# Patient Record
Sex: Male | Born: 1985 | Race: Black or African American | Hispanic: No | Marital: Single | Smoking: Never smoker
Health system: Southern US, Community
[De-identification: ages and names within clinical notes are randomized; demographics above are authoritative.]

---

## 2012-10-24 ENCOUNTER — Emergency Department (HOSPITAL_COMMUNITY)
Admission: EM | Admit: 2012-10-24 | Discharge: 2012-10-24 | Disposition: A | Discharge status: Home / Self Care | Payer: Non-veteran care | Attending: Emergency Medicine | Admitting: Emergency Medicine

## 2012-10-24 DIAGNOSIS — H579 Unspecified disorder of eye and adnexa: Secondary | ICD-10-CM | POA: Insufficient documentation

## 2012-10-24 DIAGNOSIS — H109 Unspecified conjunctivitis: Secondary | ICD-10-CM | POA: Insufficient documentation

## 2012-10-24 MED ORDER — FLUORESCEIN SODIUM 1 MG OP STRP
1.0000 | ORAL_STRIP | Freq: Once | OPHTHALMIC | Status: DC
  Filled 2012-10-24: qty 1

## 2012-10-24 MED ORDER — TETRACAINE HCL 0.5 % OP SOLN
1.0000 [drp] | Freq: Once | OPHTHALMIC | Status: DC
  Filled 2012-10-24: qty 2

## 2012-10-24 MED ORDER — POLYMYXIN B-TRIMETHOPRIM 10000-0.1 UNIT/ML-% OP SOLN: 1.0000 [drp] | OPHTHALMIC | Status: AC

## 2012-10-24 NOTE — ED Notes (Signed)
Pt c/o bilateral eye redness for the past 2 days. Pt states now he has drainage from both eyes. Pt states he woke up with his eyes glued shut this morning. Pt c/o slight itching to eyes. Pt a/o x 3. Pt ambulatory to exam room with steady gait.

## 2012-10-24 NOTE — ED Provider Notes (Signed)
Medical screening examination/treatment/procedure(s) were performed by non-physician practitioner and as supervising physician I was immediately available for consultation/collaboration.  Doug Sou, MD 10/24/12 [phone]

## 2012-10-24 NOTE — ED Provider Notes (Signed)
History    This chart was scribed for non-physician practitioner Roxy Horseman, PA working with Doug Sou, MD by Quintella Reichert, ED Scribe. This patient was seen in room WTR7/WTR7 and the patient's care was started at 3:28 PM.  CSN: [id number]  Arrival date & time 10/24/12  1450    Chief Complaint  Patient presents with  . Eye Redness     The history is provided by the patient. No language interpreter was used.    HPI Comments: Fernando Turner is a 27 y.o. male who presents to the Emergency Department with a chief complaint of bilateral eye redness and discharge.  Pt reports that he first noticed the redness 2 days ago in both eyes, and that this morning on waking he woke up with his eyes "glued shut," and he has had discharge throughout the day today.  He also complains of mild itching but notes it may be because he has been "messing with" his eyes.  He denies pain, swelling, blurred vision, or obvious recent changes to visual acuity.   He denies any foreign bodies in the eyes to his knowledge but notes that he works in [employer] and may have rubbed his eyes after touching food.  He denies recent exposure to similar symptoms.   He does not wear contacts or glasses.  He denies medication allergies.  No fevers, chills, nausea, or vomiting.   No past medical history on file.  No past surgical history on file.  No family history on file.   History  Substance Use Topics  . Smoking status: Not on file  . Smokeless tobacco: Not on file  . Alcohol Use: Not on file    Review of Systems A complete 10 system review of systems was obtained and all systems are negative except as noted in the HPI and PMH.    Allergies  Review of patient's allergies indicates no known allergies.  Home Medications  No current outpatient prescriptions on file.  BP 127/63  Pulse 61  Temp(Src) 97.7 F (36.5 C) (Oral)  Resp 16  SpO2 100%   Physical Exam  Nursing note and vitals  reviewed. Constitutional: He is oriented to person, place, and time. He appears well-developed and well-nourished. No distress.  HENT:  Head: Normocephalic and atraumatic.  Eyes: EOM are normal.  No foreign bodies. Mild purulent discharge. Eyelids everted.  Neck: Neck supple. No tracheal deviation present.  Cardiovascular: Normal rate.   Pulmonary/Chest: Effort normal. No respiratory distress.  Musculoskeletal: Normal range of motion.  Neurological: He is alert and oriented to person, place, and time.  Skin: Skin is warm and dry.  Psychiatric: He has a normal mood and affect. His behavior is normal.    ED Course  Procedures (including critical care time)  DIAGNOSTIC STUDIES: Oxygen Saturation is 100% on room air, normal by my interpretation.    COORDINATION OF CARE: 3:30 PM-Discussed treatment plan which includes fluorescein exam with pt at bedside and pt agreed to plan.   FLUORESCEIN EXAM: Tetracaine 1 drops used.  Lids everted and swept for exam, no evidence of foreign body.  Conjunctivae: Inflamed bilaterally Cornea: No evidence of abrasion.  EOM: Intact  Pupils: PERRL  Fluorescein uptake: None  Patient tolerated procedure well without immediate complications.   3:50 PM-Informed pt that symptoms are likely due to conjunctivitis.  Discussed treatment plan which includes antibiotics with pt at bedside and pt agreed to plan.    Labs Reviewed - No data to display  No results  found.  1. Conjunctivitis     MDM  Patient with bilateral conjunctivitis. No evidence of foreign bodies, no corneal abrasions, will treat with antibiotic drops. Followup with ophthalmology for new or worsening symptoms.   I personally performed the services described in this documentation, which was scribed in my presence. The recorded information has been reviewed and is accurate.     Roxy Horseman, PA-C 10/24/12 1658

## 2014-03-27 ENCOUNTER — Encounter (HOSPITAL_COMMUNITY): Payer: Self-pay | Admitting: Emergency Medicine

## 2014-03-27 ENCOUNTER — Emergency Department (HOSPITAL_COMMUNITY): Payer: BC Managed Care – PPO

## 2014-03-27 ENCOUNTER — Emergency Department (HOSPITAL_COMMUNITY)
Admission: EM | Admit: 2014-03-27 | Discharge: 2014-03-28 | Disposition: A | Discharge status: Home / Self Care | Payer: BC Managed Care – PPO | Attending: Emergency Medicine | Admitting: Emergency Medicine

## 2014-03-27 DIAGNOSIS — Z23 Encounter for immunization: Secondary | ICD-10-CM | POA: Insufficient documentation

## 2014-03-27 DIAGNOSIS — S0121XA Laceration without foreign body of nose, initial encounter: Secondary | ICD-10-CM | POA: Diagnosis not present

## 2014-03-27 DIAGNOSIS — S199XXA Unspecified injury of neck, initial encounter: Secondary | ICD-10-CM | POA: Insufficient documentation

## 2014-03-27 DIAGNOSIS — S8991XA Unspecified injury of right lower leg, initial encounter: Secondary | ICD-10-CM | POA: Insufficient documentation

## 2014-03-27 DIAGNOSIS — S3992XA Unspecified injury of lower back, initial encounter: Secondary | ICD-10-CM | POA: Diagnosis not present

## 2014-03-27 DIAGNOSIS — S01112A Laceration without foreign body of left eyelid and periocular area, initial encounter: Secondary | ICD-10-CM | POA: Insufficient documentation

## 2014-03-27 DIAGNOSIS — Y9389 Activity, other specified: Secondary | ICD-10-CM | POA: Insufficient documentation

## 2014-03-27 DIAGNOSIS — S0993XA Unspecified injury of face, initial encounter: Secondary | ICD-10-CM | POA: Diagnosis present

## 2014-03-27 DIAGNOSIS — Y92410 Unspecified street and highway as the place of occurrence of the external cause: Secondary | ICD-10-CM | POA: Diagnosis not present

## 2014-03-27 DIAGNOSIS — R4182 Altered mental status, unspecified: Secondary | ICD-10-CM | POA: Diagnosis not present

## 2014-03-27 DIAGNOSIS — S01511A Laceration without foreign body of lip, initial encounter: Secondary | ICD-10-CM | POA: Insufficient documentation

## 2014-03-27 DIAGNOSIS — S025XXA Fracture of tooth (traumatic), initial encounter for closed fracture: Secondary | ICD-10-CM | POA: Diagnosis not present

## 2014-03-27 DIAGNOSIS — Y998 Other external cause status: Secondary | ICD-10-CM | POA: Diagnosis not present

## 2014-03-27 LAB — CBC WITH DIFFERENTIAL/PLATELET
BASOS ABS: 0 10*3/uL (ref 0.0–0.1)
Basophils Relative: 0 % (ref 0–1)
EOS PCT: 0 % (ref 0–5)
Eosinophils Absolute: 0 10*3/uL (ref 0.0–0.7)
HCT: 47.5 % (ref 39.0–52.0)
Hemoglobin: 16.2 g/dL (ref 13.0–17.0)
LYMPHS ABS: 1.7 10*3/uL (ref 0.7–4.0)
LYMPHS PCT: 13 % (ref 12–46)
MCH: 28.2 pg (ref 26.0–34.0)
MCHC: 34.1 g/dL (ref 30.0–36.0)
MCV: 82.6 fL (ref 78.0–100.0)
Monocytes Absolute: 0.4 10*3/uL (ref 0.1–1.0)
Monocytes Relative: 3 % (ref 3–12)
NEUTROS ABS: 11 10*3/uL — AB (ref 1.7–7.7)
Neutrophils Relative %: 84 % — ABNORMAL HIGH (ref 43–77)
PLATELETS: 262 10*3/uL (ref 150–400)
RBC: 5.75 MIL/uL (ref 4.22–5.81)
RDW: 12.9 % (ref 11.5–15.5)
WBC: 13.1 10*3/uL — AB (ref 4.0–10.5)

## 2014-03-27 LAB — ETHANOL: ALCOHOL ETHYL (B): 199 mg/dL — AB (ref 0–11)

## 2014-03-27 LAB — BASIC METABOLIC PANEL
ANION GAP: 16 — AB (ref 5–15)
BUN: 14 mg/dL (ref 6–23)
CHLORIDE: 101 meq/L (ref 96–112)
CO2: 24 meq/L (ref 19–32)
Calcium: 9.5 mg/dL (ref 8.4–10.5)
Creatinine, Ser: 1.2 mg/dL (ref 0.50–1.35)
GFR calc Af Amer: >90 mL/min (ref >90)
GFR calc non Af Amer: 82 mL/min — ABNORMAL LOW (ref >90)
GLUCOSE: 99 mg/dL (ref 70–99)
POTASSIUM: 4.6 meq/L (ref 3.7–5.3)
SODIUM: 141 meq/L (ref 137–147)

## 2014-03-27 MED ORDER — HYDROMORPHONE HCL 1 MG/ML IJ SOLN
1.0000 mg | Freq: Once | INTRAMUSCULAR | Status: AC
  Administered 2014-03-27: 1 mg via INTRAVENOUS
  Filled 2014-03-27: qty 1

## 2014-03-27 MED ORDER — LIDOCAINE HCL (PF) 1 % IJ SOLN
5.0000 mL | Freq: Once | INTRAMUSCULAR | Status: AC
  Administered 2014-03-28: 5 mL
  Filled 2014-03-27: qty 5

## 2014-03-27 MED ORDER — SODIUM CHLORIDE 0.9 % IV BOLUS (SEPSIS)
1000.0000 mL | Freq: Once | INTRAVENOUS | Status: AC
  Administered 2014-03-27: 1000 mL via INTRAVENOUS

## 2014-03-27 MED ORDER — LIDOCAINE-EPINEPHRINE (PF) 2 %-1:200000 IJ SOLN
10.0000 mL | Freq: Once | INTRAMUSCULAR | Status: AC
  Administered 2014-03-28: 10 mL
  Filled 2014-03-27: qty 40

## 2014-03-27 MED ORDER — TETANUS-DIPHTH-ACELL PERTUSSIS 5-2.5-18.5 LF-MCG/0.5 IM SUSP
0.5000 mL | Freq: Once | INTRAMUSCULAR | Status: AC
  Administered 2014-03-27: 0.5 mL via INTRAMUSCULAR
  Filled 2014-03-27: qty 0.5

## 2014-03-27 MED ORDER — LORAZEPAM 2 MG/ML IJ SOLN
1.0000 mg | Freq: Once | INTRAMUSCULAR | Status: AC
  Administered 2014-03-27: 1 mg via INTRAVENOUS
  Filled 2014-03-27: qty 1
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
## 2014-03-28 MED ORDER — HYPROMELLOSE (GONIOSCOPIC) 2.5 % OP SOLN: 1.0000 [drp] | OPHTHALMIC | Status: AC | PRN

## 2014-03-28 MED ORDER — HYDROCODONE-ACETAMINOPHEN 5-325 MG PO TABS: 1.0000 | ORAL_TABLET | Freq: Four times a day (QID) | ORAL | Status: AC | PRN

## 2014-03-28 MED ORDER — CLINDAMYCIN HCL 150 MG PO CAPS: 300.0000 mg | ORAL_CAPSULE | Freq: Three times a day (TID) | ORAL | Status: AC
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
## 2014-03-28 NOTE — Discharge Instructions (Signed)
Facial Laceration ° A facial laceration is a cut on the face. These injuries can be painful and cause bleeding. Lacerations usually heal quickly, but they need special care to reduce scarring. °DIAGNOSIS  °Your health care provider will take a medical history, ask for details about how the injury occurred, and examine the wound to determine how deep the cut is. °TREATMENT  °Some facial lacerations may not require closure. Others may not be able to be closed because of an increased risk of infection. The risk of infection and the chance for successful closure will depend on various factors, including the amount of time since the injury occurred. °The wound may be cleaned to help prevent infection. If closure is appropriate, pain medicines may be given if needed. Your health care provider will use stitches (sutures), wound glue (adhesive), or skin adhesive strips to repair the laceration. These tools bring the skin edges together to allow for faster healing and a better cosmetic outcome. If needed, you may also be given a tetanus shot. °HOME CARE INSTRUCTIONS °· Only take over-the-counter or prescription medicines as directed by your health care provider. °· Follow your health care provider's instructions for wound care. These instructions will vary depending on the technique used for closing the wound. °For Sutures: °· Keep the wound clean and dry.   °· If you were given a bandage (dressing), you should change it at least once a day. Also change the dressing if it becomes wet or dirty, or as directed by your health care provider.   °· Wash the wound with soap and water 2 times a day. Rinse the wound off with water to remove all soap. Pat the wound dry with a clean towel.   °· After cleaning, apply a thin layer of the antibiotic ointment recommended by your health care provider. This will help prevent infection and keep the dressing from sticking.   °· You may shower as usual after the first 24 hours. Do not soak the  wound in water until the sutures are removed.   °· Get your sutures removed as directed by your health care provider. With facial lacerations, sutures should usually be taken out after 4-5 days to avoid stitch marks.   °· Wait a few days after your sutures are removed before applying any makeup. °For Skin Adhesive Strips: °· Keep the wound clean and dry.   °· Do not get the skin adhesive strips wet. You may bathe carefully, using caution to keep the wound dry.   °· If the wound gets wet, pat it dry with a clean towel.   °· Skin adhesive strips will fall off on their own. You may trim the strips as the wound heals. Do not remove skin adhesive strips that are still stuck to the wound. They will fall off in time.   °For Wound Adhesive: °· You may briefly wet your wound in the shower or bath. Do not soak or scrub the wound. Do not swim. Avoid periods of heavy sweating until the skin adhesive has fallen off on its own. After showering or bathing, gently pat the wound dry with a clean towel.   °· Do not apply liquid medicine, cream medicine, ointment medicine, or makeup to your wound while the skin adhesive is in place. This may loosen the film before your wound is healed.   °· If a dressing is placed over the wound, be careful not to apply tape directly over the skin adhesive. This may cause the adhesive to be pulled off before the wound is healed.   °· Avoid   prolonged exposure to sunlight or tanning lamps while the skin adhesive is in place.  The skin adhesive will usually remain in place for 5-10 days, then naturally fall off the skin. Do not pick at the adhesive film.  After Healing: Once the wound has healed, cover the wound with sunscreen during the day for 1 full year. This can help minimize scarring. Exposure to ultraviolet light in the first year will darken the scar. It can take 1-2 years for the scar to lose its redness and to heal completely.  SEEK IMMEDIATE MEDICAL CARE IF:  You have redness, pain, or  swelling around the wound.   You see ayellowish-white fluid (pus) coming from the wound.   You have chills or a fever.  MAKE SURE YOU:  Understand these instructions.  Will watch your condition.  Will get help right away if you are not doing well or get worse. Document Released: 05/02/2004 Document Revised: 01/13/2013 Document Reviewed: 11/05/2012 [facility] Patient Information 2015 [place], [place]. This information is not intended to replace advice given to you by your health care provider. Make sure you discuss any questions you have with your health care provider. Motor Vehicle Collision It is common to have multiple bruises and sore muscles after a motor vehicle collision (MVC). These tend to feel worse for the first 24 hours. You may have the most stiffness and soreness over the first several hours. You may also feel worse when you wake up the first morning after your collision. After this point, you will usually begin to improve with each day. The speed of improvement often depends on the severity of the collision, the number of injuries, and the location and nature of these injuries. HOME CARE INSTRUCTIONS  Put ice on the injured area.  Put ice in a plastic bag.  Place a towel between your skin and the bag.  Leave the ice on for 15-20 minutes, 3-4 times a day, or as directed by your health care provider.  Drink enough fluids to keep your urine clear or pale yellow. Do not drink alcohol.  Take a warm shower or bath once or twice a day. This will increase blood flow to sore muscles.  You may return to activities as directed by your caregiver. Be careful when lifting, as this may aggravate neck or back pain.  Only take over-the-counter or prescription medicines for pain, discomfort, or fever as directed by your caregiver. Do not use aspirin. This may increase bruising and bleeding. SEEK IMMEDIATE MEDICAL CARE IF:  You have numbness, tingling, or weakness in the arms or  legs.  You develop severe headaches not relieved with medicine.  You have severe neck pain, especially tenderness in the middle of the back of your neck.  You have changes in bowel or bladder control.  There is increasing pain in any area of the body.  You have shortness of breath, light-headedness, dizziness, or fainting.  You have chest pain.  You feel sick to your stomach (nauseous), throw up (vomit), or sweat.  You have increasing abdominal discomfort.  There is blood in your urine, stool, or vomit.  You have pain in your shoulder (shoulder strap areas).  You feel your symptoms are getting worse. MAKE SURE YOU:  Understand these instructions.  Will watch your condition.  Will get help right away if you are not doing well or get worse. Document Released: 03/25/2005 Document Revised: 08/09/2013 Document Reviewed: 08/22/2010 [facility] CenterExitCare Patient Information 2015 [place], [place]. This information is not intended to  replace advice given to you by your health care provider. Make sure you discuss any questions you have with your health care provider. ° °

## 2015-03-27 ENCOUNTER — Emergency Department (HOSPITAL_BASED_OUTPATIENT_CLINIC_OR_DEPARTMENT_OTHER)
Admission: EM | Admit: 2015-03-27 | Discharge: 2015-03-28 | Disposition: A | Discharge status: Home / Self Care | Payer: Non-veteran care | Attending: Emergency Medicine | Admitting: Emergency Medicine

## 2015-03-27 ENCOUNTER — Encounter (HOSPITAL_BASED_OUTPATIENT_CLINIC_OR_DEPARTMENT_OTHER): Payer: Self-pay | Admitting: *Deleted

## 2015-03-27 ENCOUNTER — Emergency Department (HOSPITAL_BASED_OUTPATIENT_CLINIC_OR_DEPARTMENT_OTHER): Payer: Non-veteran care

## 2015-03-27 DIAGNOSIS — Z79899 Other long term (current) drug therapy: Secondary | ICD-10-CM | POA: Insufficient documentation

## 2015-03-27 DIAGNOSIS — N390 Urinary tract infection, site not specified: Secondary | ICD-10-CM

## 2015-03-27 DIAGNOSIS — R52 Pain, unspecified: Secondary | ICD-10-CM

## 2015-03-27 DIAGNOSIS — R319 Hematuria, unspecified: Secondary | ICD-10-CM | POA: Diagnosis present

## 2015-03-27 LAB — URINALYSIS, ROUTINE W REFLEX MICROSCOPIC
Bilirubin Urine: NEGATIVE
GLUCOSE, UA: NEGATIVE mg/dL
KETONES UR: NEGATIVE mg/dL
NITRITE: POSITIVE — AB
PH: 5.5 (ref 5.0–8.0)
Protein, ur: >300 mg/dL — AB
SPECIFIC GRAVITY, URINE: 1.022 (ref 1.005–1.030)

## 2015-03-27 LAB — BASIC METABOLIC PANEL
ANION GAP: 9 (ref 5–15)
BUN: 19 mg/dL (ref 6–20)
CALCIUM: 9.3 mg/dL (ref 8.9–10.3)
CO2: 29 mmol/L (ref 22–32)
CREATININE: 1.25 mg/dL — AB (ref 0.61–1.24)
Chloride: 100 mmol/L — ABNORMAL LOW (ref 101–111)
Glucose, Bld: 94 mg/dL (ref 65–99)
Potassium: 3.7 mmol/L (ref 3.5–5.1)
SODIUM: 138 mmol/L (ref 135–145)

## 2015-03-27 LAB — URINE MICROSCOPIC-ADD ON

## 2015-03-27 MED ORDER — AZITHROMYCIN 1 G PO PACK
1.0000 g | PACK | Freq: Once | ORAL | Status: AC
  Administered 2015-03-28: 1 g via ORAL
  Filled 2015-03-27: qty 1

## 2015-03-27 MED ORDER — CEFTRIAXONE SODIUM 1 G IJ SOLR
1.0000 g | Freq: Once | INTRAMUSCULAR | Status: AC
  Administered 2015-03-28: 1 g via INTRAMUSCULAR
  Filled 2015-03-27: qty 10

## 2015-03-27 MED ORDER — NAPROXEN 250 MG PO TABS
500.0000 mg | ORAL_TABLET | Freq: Once | ORAL | Status: AC
  Administered 2015-03-28: 500 mg via ORAL
  Filled 2015-03-27: qty 2

## 2015-03-27 NOTE — ED Notes (Signed)
Pt c/o bilateral lower back pain with diffuse abdominal pain for the last several days as well as decreased urine flow, turning to hematuria.  Pt denies n/v, one episode of diarrhea yesterday.  Pt urine is cloudy and amber colored.  Pt denies any STD contacts, states he was tested a few months ago and uses contraception.  Pt appears comfortable right now, no acute distress noted.

## 2015-03-27 NOTE — ED Provider Notes (Signed)
CSN: [id number]     Arrival date & time 03/27/15  2052 History  By signing my name below, I, Ronney Lion, attest that this documentation has been prepared under the direction and in the presence of Synia Douglass, MD. Electronically Signed: Ronney Lion, ED Scribe. 03/27/2015. 11:39 PM.  Chief Complaint  Patient presents with  . Hematuria   Patient is a 29 y.o. male presenting with hematuria. The history is provided by the patient. No language interpreter was used.  Hematuria This is a new problem. The current episode started 2 days ago. The problem occurs constantly. The problem has been gradually worsening. Associated symptoms include abdominal pain. Nothing aggravates the symptoms. Nothing relieves the symptoms. He has tried nothing for the symptoms.   HPI Comments: Fernando Turner is a 29 y.o. male who presents to the Emergency Department complaining of diffuse abdominal pain and bilateral low back pain that began several days ago. He also complains of a "weak urine flow" that began around the same time, as well as urinary frequency. He denies a history of urinary tract infections. Patient is circumcised. He denies any new sexual partners in the past year or unprotected sexual activity. He also denies a history of STD's. Patient states he had been fishing while traveling internationally in the [place] recently but denies any time spent in hot tubs or bubble baths. He states he has tried resting and drinking plenty of fluids but otherwise has not had any treatments or medications for this. He denies dysuria, penile discharge, fever, or vomiting.   History reviewed. No pertinent past medical history. History reviewed. No pertinent past surgical history. No family history on file. Social History  Substance Use Topics  . Smoking status: Never Smoker   . Smokeless tobacco: None  . Alcohol Use: Yes     Comment: daily    Review of Systems  Constitutional: Negative for fever.   Gastrointestinal: Positive for abdominal pain. Negative for vomiting.  Genitourinary: Positive for frequency, hematuria and difficulty urinating. Negative for dysuria, discharge and penile pain.  Musculoskeletal: Positive for back pain.  All other systems reviewed and are negative.  Allergies  Review of patient's allergies indicates no known allergies.  Home Medications   Prior to Admission medications   Medication Sig Start Date End Date Taking? Authorizing Provider  clindamycin (CLEOCIN) 150 MG capsule Take 2 capsules (300 mg total) by mouth 3 (three) times daily. May dispense as  capsules 03/28/14   Roxy Horseman, PA-C  HYDROcodone-acetaminophen (NORCO/VICODIN) 5-325 MG per tablet Take 1-2 tablets by mouth every 6 (six) hours as needed for moderate pain or severe pain. 03/28/14   Roxy Horseman, PA-C  hydroxypropyl methylcellulose / hypromellose (ISOPTO TEARS / GONIOVISC) 2.5 % ophthalmic solution Place 1 drop into the left eye every hour as needed for dry eyes. 03/28/14   Roxy Horseman, PA-C  trimethoprim-polymyxin b (POLYTRIM) ophthalmic solution Place 1 drop into both eyes every 4 (four) hours. 10/24/12   Roxy Horseman, PA-C   BP 135/90 mmHg  Pulse 89  Temp(Src) 98.8 F (37.1 C) (Oral)  Resp 18  Ht  (1.905 m)  Wt 180 lb (81.647 kg)  BMI 22.50 kg/m2  SpO2 100% Physical Exam  Constitutional: He is oriented to person, place, and time. He appears well-developed and well-nourished. No distress.  HENT:  Head: Normocephalic and atraumatic.  Mouth/Throat: Oropharynx is clear and moist. No oropharyngeal exudate.  Moist mucous membranes. No exudates.   Eyes: Conjunctivae and EOM are normal. Pupils are  equal, round, and reactive to light.  Neck: Neck supple. No tracheal deviation present.  Cardiovascular: Normal rate and regular rhythm.   Pulmonary/Chest: Effort normal. No respiratory distress. He has no wheezes. He has no rales.  Abdominal: Soft. He exhibits no mass.  There is no tenderness. There is no rebound and no guarding.  Musculoskeletal: Normal range of motion.  Neurological: He is alert and oriented to person, place, and time.  Skin: Skin is warm and dry.  Psychiatric: He has a normal mood and affect. His behavior is normal.  Nursing note and vitals reviewed.   ED Course  Procedures (including critical care time)  DIAGNOSTIC STUDIES: Oxygen Saturation is 100% on RA, normal by my interpretation.    COORDINATION OF CARE: 11:05 PM - Discussed treatment plan with pt at bedside which includes CT renal stone study. Pt verbalized understanding and agreed to plan.   Labs Review Labs Reviewed  URINALYSIS, ROUTINE W REFLEX MICROSCOPIC (NOT [facility]) - Abnormal; Notable for the following:    Color, Urine AMBER (*)    APPearance CLOUDY (*)    Hgb urine dipstick LARGE (*)    Protein, ur >300 (*)    Nitrite POSITIVE (*)    Leukocytes, UA MODERATE (*)    All other components within normal limits  URINE MICROSCOPIC-ADD ON - Abnormal; Notable for the following:    Squamous Epithelial / LPF 0-5 (*)    Bacteria, UA MANY (*)    All other components within normal limits  BASIC METABOLIC PANEL - Abnormal; Notable for the following:    Chloride 100 (*)    Creatinine, Ser 1.25 (*)    All other components within normal limits  GC/CHLAMYDIA PROBE AMP ([place]) NOT AT [facility]    Imaging Review Ct Renal Stone Study  03/27/2015  CLINICAL DATA:  Bilateral flank pain for 1 day with blood in urine. EXAM: CT ABDOMEN AND PELVIS WITHOUT CONTRAST TECHNIQUE: Multidetector CT imaging of the abdomen and pelvis was performed following the standard protocol without IV contrast. COMPARISON:  None. FINDINGS: Lower chest and abdominal wall:  No contributory findings. Hepatobiliary: No focal liver abnormality.No evidence of biliary obstruction or stone. Pancreas: Unremarkable. Spleen: Unremarkable. Adrenals/Urinary Tract: Negative adrenals. No hydronephrosis or stone.  Unremarkable bladder. Reproductive:No pathologic findings. Stomach/Bowel:  No obstruction. No appendicitis. Vascular/Lymphatic: No acute vascular abnormality. No mass or adenopathy. Peritoneal: No ascites or pneumoperitoneum. Musculoskeletal: Partly seen ossification extending from the right femur lesser trochanter, compatible with remote avulsion injury. IMPRESSION: Negative.  No explanation for symptoms. Electronically Signed   By: Marnee SpringJonathon  Watts M.D.   On: 03/27/2015 23:29   I have personally reviewed and evaluated these images and lab results as part of my medical decision-making.   EKG Interpretation None      MDM   Final diagnoses:  Pain   Medications  cefTRIAXone (ROCEPHIN) injection 1 g (1 g Intramuscular Given 03/28/15 0007)  azithromycin (ZITHROMAX) powder 1 g (1 g Oral Given 03/28/15 0006)  naproxen (NAPROSYN) tablet 500 mg (500 mg Oral Given 03/28/15 0005)   Treated for STI, no sexual activity of any kind until 7 days after all partners treated.  RX for keflex and follow up with urology for recheck.  Patient verbalizes understanding and agrees to follow up  I personally performed the services described in this documentation, which was scribed in my presence. The recorded information has been reviewed and is accurate.       Cy BlamerApril Curstin Schmale, MD 03/28/15 [phone]

## 2015-03-27 NOTE — ED Notes (Signed)
Hematuria, lower abdominal and lower back pain x 2 days.

## 2015-03-28 ENCOUNTER — Encounter (HOSPITAL_BASED_OUTPATIENT_CLINIC_OR_DEPARTMENT_OTHER): Payer: Self-pay | Admitting: Emergency Medicine

## 2015-03-28 LAB — GC/CHLAMYDIA PROBE AMP (~~LOC~~) NOT AT ARMC
Chlamydia: NEGATIVE
Neisseria Gonorrhea: NEGATIVE

## 2015-03-28 MED ORDER — CEPHALEXIN 500 MG PO CAPS: 500.0000 mg | ORAL_CAPSULE | Freq: Four times a day (QID) | ORAL | Status: AC

## 2015-03-28 MED ORDER — IBUPROFEN 800 MG PO TABS: 800.0000 mg | ORAL_TABLET | Freq: Three times a day (TID) | ORAL | Status: AC

## 2017-06-12 IMAGING — CT CT RENAL STONE PROTOCOL
2 of 4 series · 17 of 46 positions shown, 19 images · non-contrast
Comparison: None.

CLINICAL DATA: Bilateral flank pain for 1 day with blood in urine.

EXAM:
CT ABDOMEN AND PELVIS WITHOUT CONTRAST
TECHNIQUE: Multidetector CT imaging of the abdomen and pelvis was performed
following the standard protocol without IV contrast.

[Series 2: renal stone < 200 lbs 5.0 b31f · axial · 0.71mm/px · z∈[-509,-114]mm · 14 of 87 slices shown, 16 images]
[im 4/87  soft-tissue]
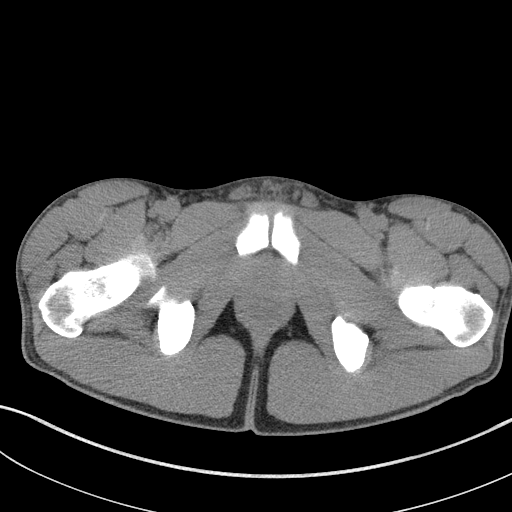
[im 4/87  bone]
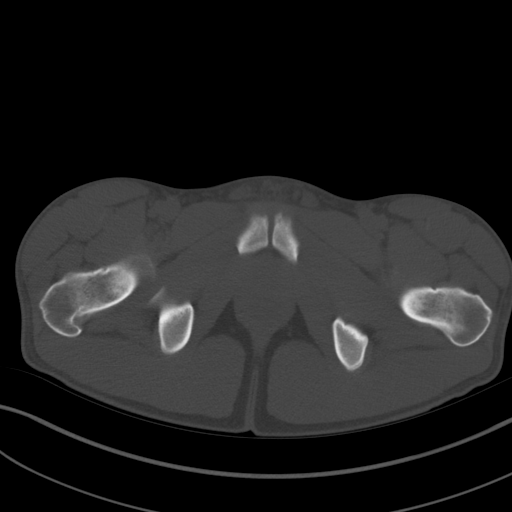
[im 11/87  soft-tissue]
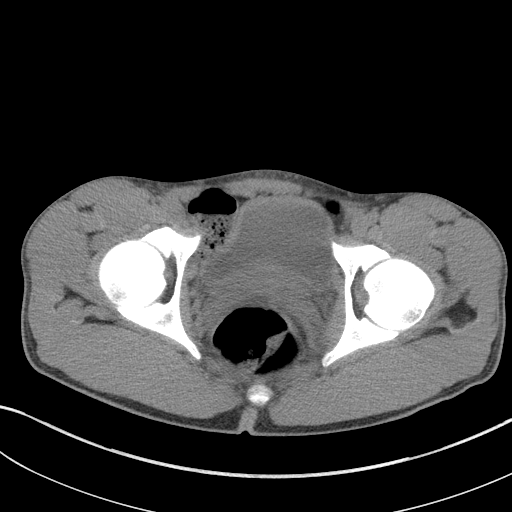
[im 18/87  soft-tissue]
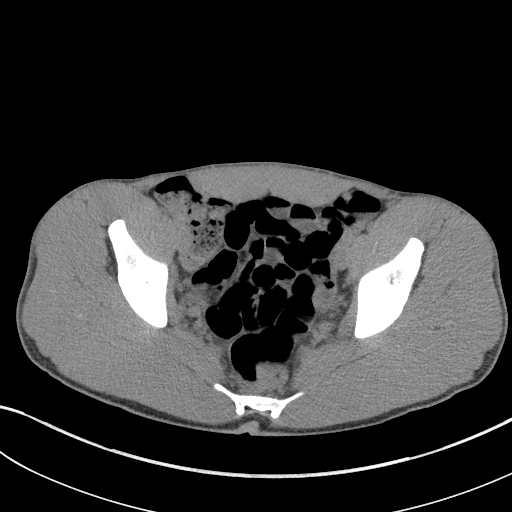
[im 22/87  soft-tissue]
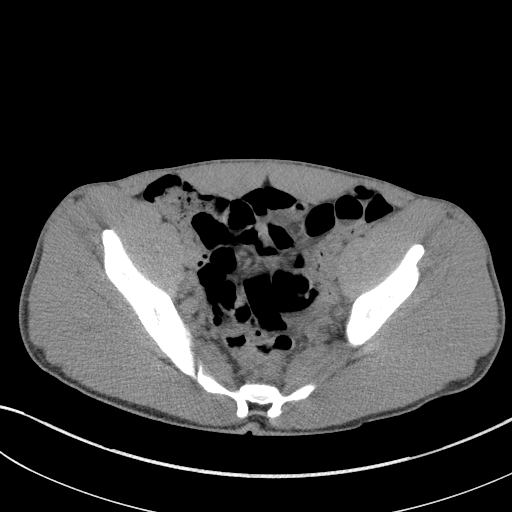
[im 29/87  soft-tissue]
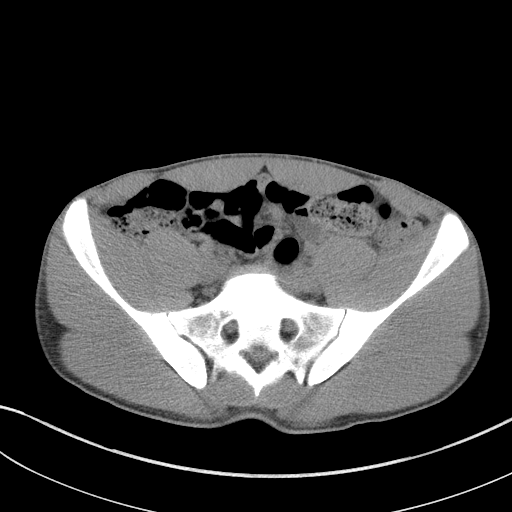
[im 36/87  soft-tissue]
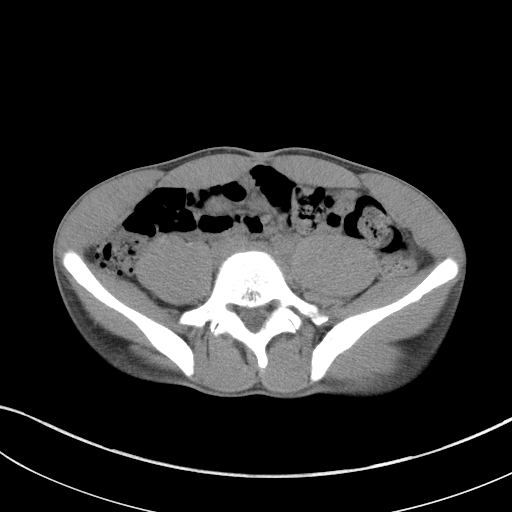
[im 40/87  soft-tissue]
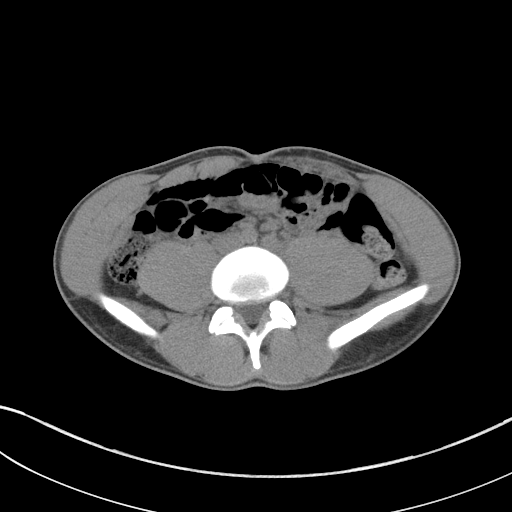
[im 47/87  soft-tissue]
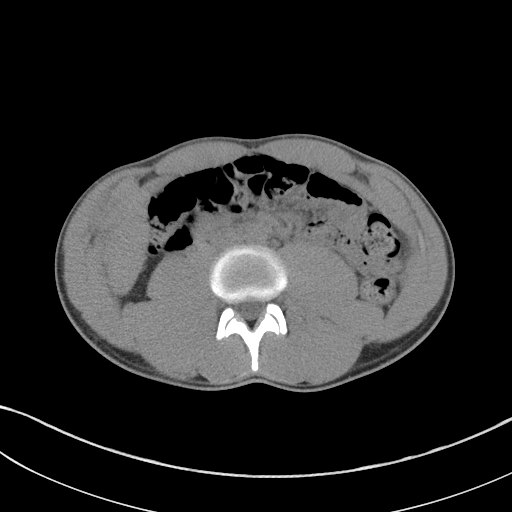
[im 51/87  soft-tissue]
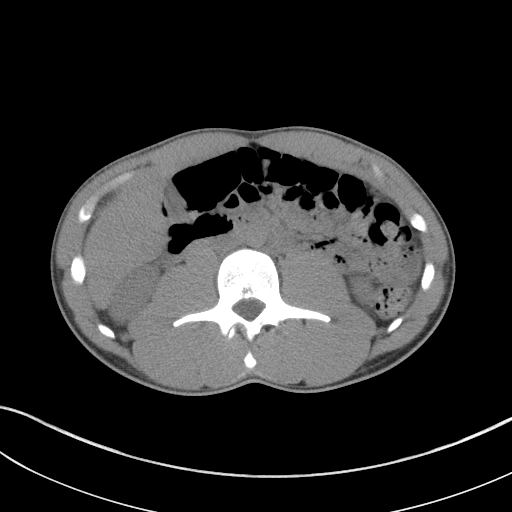
[im 51/87  bone]
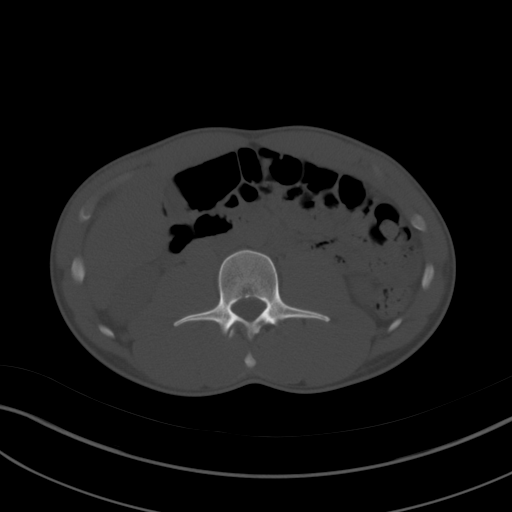
[im 58/87  soft-tissue]
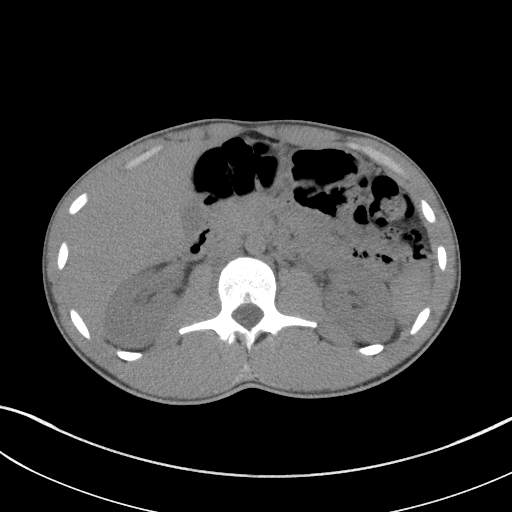
[im 65/87  soft-tissue]
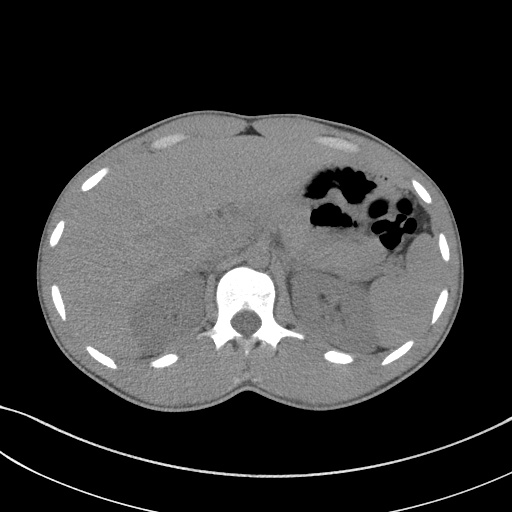
[im 69/87  soft-tissue]
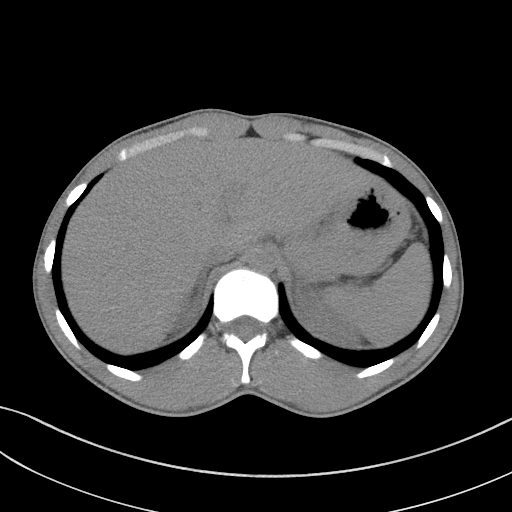
[im 76/87  soft-tissue]
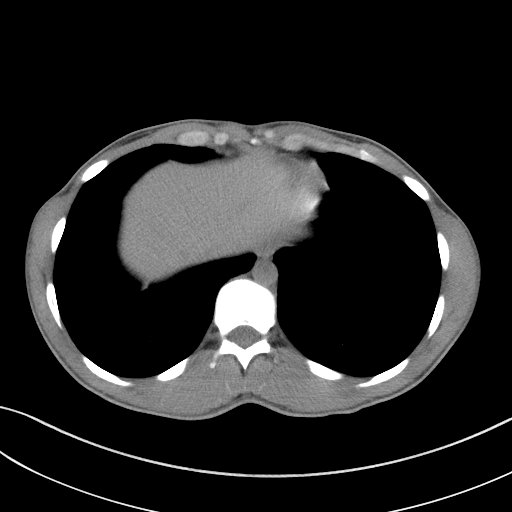
[im 83/87  soft-tissue]
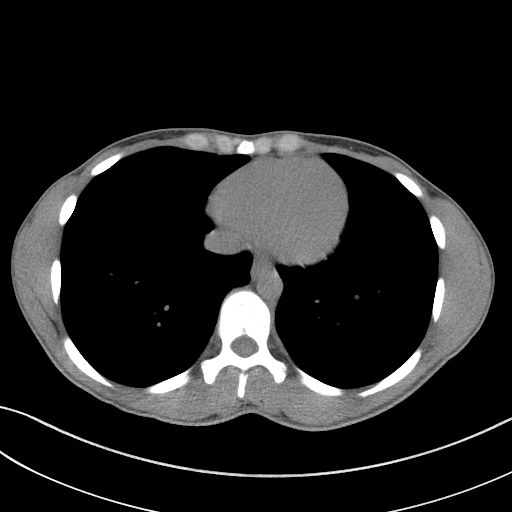

[Series 5: renal stone 3.0 coronal · coronal · 0.77mm/px · 3 of 68 slices shown]
[im 23/68  soft-tissue]
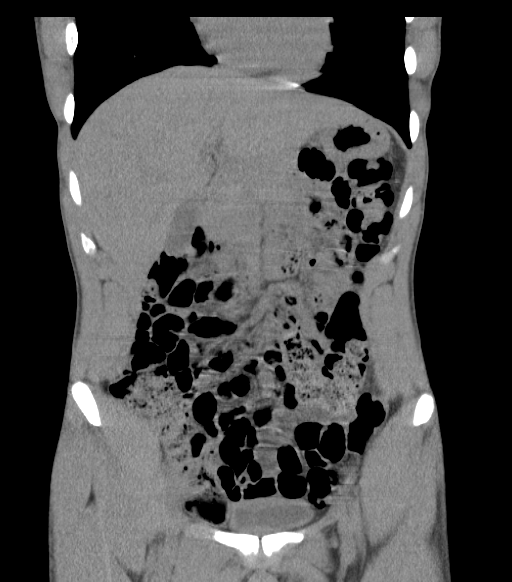
[im 30/68  soft-tissue]
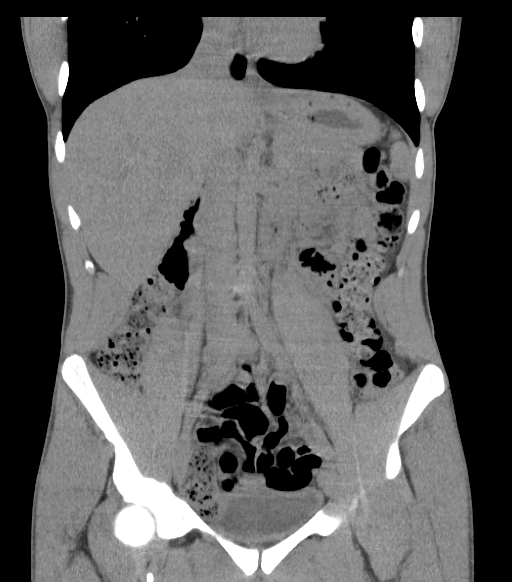
[im 38/68  soft-tissue]
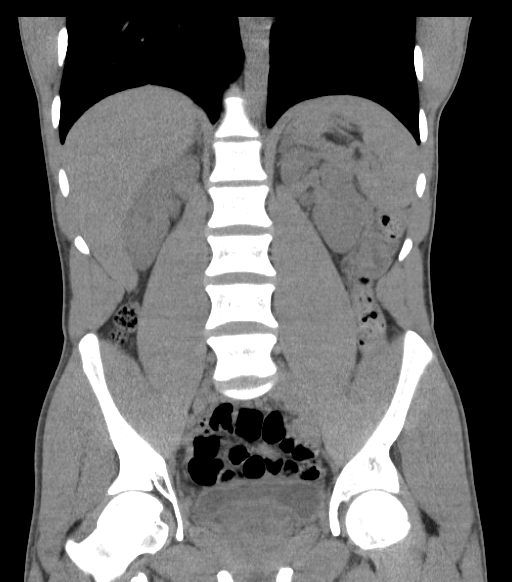

[17 of 46 positions shown; findings below may reference images not displayed]

FINDINGS: Lower chest and abdominal wall:  No contributory findings.

Hepatobiliary: No focal liver abnormality.No evidence of biliary
obstruction or stone.

Pancreas: Unremarkable.

Spleen: Unremarkable.

Adrenals/Urinary Tract: Negative adrenals. No hydronephrosis or
stone. Unremarkable bladder.

Reproductive:No pathologic findings.

Stomach/Bowel:  No obstruction. No appendicitis.

Vascular/Lymphatic: No acute vascular abnormality. No mass or
adenopathy.

Peritoneal: No ascites or pneumoperitoneum.

Musculoskeletal: Partly seen ossification extending from the right
femur lesser trochanter, compatible with remote avulsion injury.
IMPRESSION: Negative.  No explanation for symptoms.
